# Patient Record
Sex: Female | Born: 1985 | Race: Black or African American | Hispanic: No | Marital: Single | Smoking: Current every day smoker
Health system: Southern US, Community
[De-identification: ages and names within clinical notes are randomized; demographics above are authoritative.]

## PROBLEM LIST (undated history)

## (undated) DIAGNOSIS — IMO0001 Reserved for inherently not codable concepts without codable children: Secondary | ICD-10-CM

## (undated) DIAGNOSIS — R569 Unspecified convulsions: Secondary | ICD-10-CM

## (undated) DIAGNOSIS — D649 Anemia, unspecified: Secondary | ICD-10-CM

## (undated) HISTORY — DX: Anemia, unspecified: D64.9

---
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
## 2007-01-05 ENCOUNTER — Observation Stay: Payer: Self-pay

## 2007-01-12 ENCOUNTER — Inpatient Hospital Stay: Payer: Self-pay | Admitting: Obstetrics and Gynecology

## 2007-02-11 ENCOUNTER — Emergency Department: Payer: Self-pay | Admitting: Emergency Medicine

## 2007-04-11 ENCOUNTER — Emergency Department: Payer: Self-pay | Admitting: Emergency Medicine

## 2008-03-25 ENCOUNTER — Emergency Department: Payer: Self-pay | Admitting: Emergency Medicine

## 2008-06-14 ENCOUNTER — Emergency Department: Payer: Self-pay | Admitting: Emergency Medicine

## 2009-01-13 ENCOUNTER — Emergency Department: Payer: Self-pay | Admitting: Emergency Medicine

## 2009-05-03 ENCOUNTER — Ambulatory Visit: Payer: Self-pay | Admitting: Neurology

## 2010-01-06 ENCOUNTER — Emergency Department: Payer: Self-pay | Admitting: Emergency Medicine

## 2013-01-05 DIAGNOSIS — Z720 Tobacco use: Secondary | ICD-10-CM | POA: Insufficient documentation

## 2013-01-05 DIAGNOSIS — O0990 Supervision of high risk pregnancy, unspecified, unspecified trimester: Secondary | ICD-10-CM | POA: Insufficient documentation

## 2013-01-05 DIAGNOSIS — D649 Anemia, unspecified: Secondary | ICD-10-CM | POA: Insufficient documentation

## 2013-01-05 DIAGNOSIS — R87619 Unspecified abnormal cytological findings in specimens from cervix uteri: Secondary | ICD-10-CM | POA: Insufficient documentation

## 2013-01-05 DIAGNOSIS — E663 Overweight: Secondary | ICD-10-CM | POA: Insufficient documentation

## 2013-01-05 DIAGNOSIS — G40909 Epilepsy, unspecified, not intractable, without status epilepticus: Secondary | ICD-10-CM | POA: Insufficient documentation

## 2013-03-04 ENCOUNTER — Observation Stay: Payer: Self-pay | Admitting: Obstetrics and Gynecology

## 2013-03-04 LAB — URINALYSIS, COMPLETE
Bilirubin,UR: NEGATIVE
Glucose,UR: NEGATIVE mg/dL (ref 0–75)
Ketone: NEGATIVE
Nitrite: NEGATIVE
Ph: 7 (ref 4.5–8.0)
Specific Gravity: 1.011 (ref 1.003–1.030)

## 2013-03-06 LAB — URINE CULTURE

## 2013-05-16 DIAGNOSIS — A568 Sexually transmitted chlamydial infection of other sites: Secondary | ICD-10-CM | POA: Insufficient documentation

## 2013-05-27 HISTORY — PX: TUBAL LIGATION: SHX77

## 2015-07-15 ENCOUNTER — Emergency Department
Admission: EM | Admit: 2015-07-15 | Discharge: 2015-07-15 | Disposition: A | Discharge status: Home / Self Care | Payer: Self-pay | Attending: Emergency Medicine | Admitting: Emergency Medicine

## 2015-07-15 ENCOUNTER — Encounter: Payer: Self-pay | Admitting: Emergency Medicine

## 2015-07-15 DIAGNOSIS — R109 Unspecified abdominal pain: Secondary | ICD-10-CM | POA: Insufficient documentation

## 2015-07-15 DIAGNOSIS — Z3202 Encounter for pregnancy test, result negative: Secondary | ICD-10-CM | POA: Insufficient documentation

## 2015-07-15 DIAGNOSIS — R12 Heartburn: Secondary | ICD-10-CM | POA: Insufficient documentation

## 2015-07-15 DIAGNOSIS — F17210 Nicotine dependence, cigarettes, uncomplicated: Secondary | ICD-10-CM | POA: Insufficient documentation

## 2015-07-15 DIAGNOSIS — R197 Diarrhea, unspecified: Secondary | ICD-10-CM | POA: Insufficient documentation

## 2015-07-15 DIAGNOSIS — R1110 Vomiting, unspecified: Secondary | ICD-10-CM | POA: Insufficient documentation

## 2015-07-15 HISTORY — DX: Unspecified convulsions: R56.9

## 2015-07-15 LAB — URINALYSIS COMPLETE WITH MICROSCOPIC (ARMC ONLY)
BACTERIA UA: NONE SEEN
GLUCOSE, UA: NEGATIVE mg/dL
HGB URINE DIPSTICK: NEGATIVE
NITRITE: NEGATIVE
Protein, ur: 100 mg/dL — AB
SPECIFIC GRAVITY, URINE: 1.032 — AB (ref 1.005–1.030)
pH: 5 (ref 5.0–8.0)

## 2015-07-15 LAB — CBC
HEMATOCRIT: 34.7 % — AB (ref 35.0–47.0)
HEMOGLOBIN: 11.4 g/dL — AB (ref 12.0–16.0)
MCH: 24.3 pg — ABNORMAL LOW (ref 26.0–34.0)
MCHC: 32.8 g/dL (ref 32.0–36.0)
MCV: 74 fL — ABNORMAL LOW (ref 80.0–100.0)
Platelets: 258 10*3/uL (ref 150–440)
RBC: 4.69 MIL/uL (ref 3.80–5.20)
RDW: 16.5 % — AB (ref 11.5–14.5)
WBC: 12 10*3/uL — AB (ref 3.6–11.0)

## 2015-07-15 LAB — COMPREHENSIVE METABOLIC PANEL
ALT: 980 U/L — ABNORMAL HIGH (ref 14–54)
ANION GAP: 8 (ref 5–15)
AST: 1480 U/L — ABNORMAL HIGH (ref 15–41)
Albumin: 4.3 g/dL (ref 3.5–5.0)
Alkaline Phosphatase: 293 U/L — ABNORMAL HIGH (ref 38–126)
BILIRUBIN TOTAL: 1.6 mg/dL — AB (ref 0.3–1.2)
BUN: 9 mg/dL (ref 6–20)
CO2: 24 mmol/L (ref 22–32)
Calcium: 9.4 mg/dL (ref 8.9–10.3)
Chloride: 106 mmol/L (ref 101–111)
Creatinine, Ser: 0.7 mg/dL (ref 0.44–1.00)
GFR calc Af Amer: >60 mL/min (ref >60)
Glucose, Bld: 150 mg/dL — ABNORMAL HIGH (ref 65–99)
POTASSIUM: 3.7 mmol/L (ref 3.5–5.1)
Sodium: 138 mmol/L (ref 135–145)
TOTAL PROTEIN: 8 g/dL (ref 6.5–8.1)

## 2015-07-15 LAB — LIPASE, BLOOD: Lipase: 1557 U/L — ABNORMAL HIGH (ref 11–51)

## 2015-07-15 LAB — POCT PREGNANCY, URINE: PREG TEST UR: NEGATIVE

## 2015-07-15 NOTE — ED Notes (Signed)
Pt missed am dose of Keppra and also threw up last nights dose.

## 2015-07-15 NOTE — ED Notes (Signed)
Pt reports multiple episodes of vomiting last night and today. Diarrhea started yesterday. Reports having frequent episodes of heartburn that she has had for 9 months.  Pt takes nexium every other day. Unable to keep any liquids down today. C/o abdominal pain from middle of the chest down through epigastric region.

## 2015-07-15 NOTE — ED Provider Notes (Signed)
Patient not in room when I went to go see her  Breanna Every, MD 07/15/15 1534

## 2015-07-18 ENCOUNTER — Telehealth: Payer: Self-pay | Admitting: Emergency Medicine

## 2015-07-18 LAB — LEVETIRACETAM LEVEL: LEVETIRACETAM: NOT DETECTED ug/mL (ref 10.0–40.0)

## 2015-07-18 NOTE — ED Notes (Signed)
Called patient due to lwot to inquire about condition and follow up plans. Left message with my number. 

## 2015-07-19 DIAGNOSIS — K8020 Calculus of gallbladder without cholecystitis without obstruction: Secondary | ICD-10-CM | POA: Insufficient documentation

## 2015-07-19 DIAGNOSIS — F17210 Nicotine dependence, cigarettes, uncomplicated: Secondary | ICD-10-CM | POA: Insufficient documentation

## 2015-07-19 LAB — CBC
HCT: 37.3 % (ref 35.0–47.0)
HEMOGLOBIN: 12.1 g/dL (ref 12.0–16.0)
MCH: 23.9 pg — ABNORMAL LOW (ref 26.0–34.0)
MCHC: 32.4 g/dL (ref 32.0–36.0)
MCV: 73.8 fL — AB (ref 80.0–100.0)
PLATELETS: 339 10*3/uL (ref 150–440)
RBC: 5.06 MIL/uL (ref 3.80–5.20)
RDW: 16.4 % — ABNORMAL HIGH (ref 11.5–14.5)
WBC: 6 10*3/uL (ref 3.6–11.0)

## 2015-07-19 NOTE — ED Notes (Signed)
Pt started having n.v.d since Friday, was seen here Saturday for the same but left without being seen by MD.  Was called back for abnormal blood work was it "might be gallbladder".  Patient was called Saturday but could not come in till today.  States all symptoms have been relieved.

## 2015-07-20 ENCOUNTER — Emergency Department: Payer: Self-pay

## 2015-07-20 ENCOUNTER — Encounter: Payer: Self-pay | Admitting: Emergency Medicine

## 2015-07-20 ENCOUNTER — Emergency Department
Admission: EM | Admit: 2015-07-20 | Discharge: 2015-07-20 | Disposition: A | Discharge status: Home / Self Care | Payer: Self-pay | Attending: Emergency Medicine | Admitting: Emergency Medicine

## 2015-07-20 DIAGNOSIS — K8020 Calculus of gallbladder without cholecystitis without obstruction: Secondary | ICD-10-CM

## 2015-07-20 LAB — COMPREHENSIVE METABOLIC PANEL
ALBUMIN: 4.8 g/dL (ref 3.5–5.0)
ALK PHOS: 222 U/L — AB (ref 38–126)
ALT: 294 U/L — ABNORMAL HIGH (ref 14–54)
ANION GAP: 12 (ref 5–15)
AST: 49 U/L — AB (ref 15–41)
BUN: 12 mg/dL (ref 6–20)
CALCIUM: 9.7 mg/dL (ref 8.9–10.3)
CO2: 27 mmol/L (ref 22–32)
Chloride: 102 mmol/L (ref 101–111)
Creatinine, Ser: 0.78 mg/dL (ref 0.44–1.00)
GFR calc Af Amer: >60 mL/min (ref >60)
GFR calc non Af Amer: >60 mL/min (ref >60)
GLUCOSE: 108 mg/dL — AB (ref 65–99)
Potassium: 3 mmol/L — ABNORMAL LOW (ref 3.5–5.1)
SODIUM: 141 mmol/L (ref 135–145)
Total Bilirubin: 0.5 mg/dL (ref 0.3–1.2)
Total Protein: 8.9 g/dL — ABNORMAL HIGH (ref 6.5–8.1)

## 2015-07-20 LAB — LIPASE, BLOOD: Lipase: 128 U/L — ABNORMAL HIGH (ref 11–51)

## 2015-07-20 NOTE — Discharge Instructions (Signed)
As we discussed please follow-up with Gen. surgery by calling the number provided today to arrange a follow-up appointment as soon as possible. Return to the emergency department for any abdominal pain or fever.   Cholelithiasis Cholelithiasis (also called gallstones) is a form of gallbladder disease in which gallstones form in your gallbladder. The gallbladder is an organ that stores bile made in the liver, which helps digest fats. Gallstones begin as small crystals and slowly grow into stones. Gallstone pain occurs when the gallbladder spasms and a gallstone is blocking the duct. Pain can also occur when a stone passes out of the duct.  RISK FACTORS  Being female.   Having multiple pregnancies. Health care providers sometimes advise removing diseased gallbladders before future pregnancies.   Being obese.  Eating a diet heavy in fried foods and fat.   Being older than 60 years and increasing age.   Prolonged use of medicines containing female hormones.   Having diabetes mellitus.   Rapidly losing weight.   Having a family history of gallstones (heredity).  SYMPTOMS  Nausea.   Vomiting.  Abdominal pain.   Yellowing of the skin (jaundice).   Sudden pain. It may persist from several minutes to several hours.  Fever.   Tenderness to the touch. In some cases, when gallstones do not move into the bile duct, people have no pain or symptoms. These are called "silent" gallstones.  TREATMENT Silent gallstones do not need treatment. In severe cases, emergency surgery may be required. Options for treatment include:  Surgery to remove the gallbladder. This is the most common treatment.  Medicines. These do not always work and may take 6-12 months or more to work.  Shock wave treatment (extracorporeal biliary lithotripsy). In this treatment an ultrasound machine sends shock waves to the gallbladder to break gallstones into smaller pieces that can pass into the  intestines or be dissolved by medicine. HOME CARE INSTRUCTIONS   Only take over-the-counter or prescription medicines for pain, discomfort, or fever as directed by your health care provider.   Follow a low-fat diet until seen again by your health care provider. Fat causes the gallbladder to contract, which can result in pain.   Follow up with your health care provider as directed. Attacks are almost always recurrent and surgery is usually required for permanent treatment.  SEEK IMMEDIATE MEDICAL CARE IF:   Your pain increases and is not controlled by medicines.   You have a fever or persistent symptoms for more than 2-3 days.   You have a fever and your symptoms suddenly get worse.   You have persistent nausea and vomiting.  MAKE SURE YOU:   Understand these instructions.  Will watch your condition.  Will get help right away if you are not doing well or get worse.   This information is not intended to replace advice given to you by your health care provider. Make sure you discuss any questions you have with your health care provider.   Document Released: 05/09/2005 Document Revised: 01/13/2013 Document Reviewed: 11/04/2012 Elsevier Interactive Patient Education [employer]

## 2015-07-20 NOTE — ED Provider Notes (Signed)
[facility] Emergency Department Provider Note  Time seen: 3:33 AM  I have reviewed the triage vital signs and the nursing notes.   HISTORY  Chief Complaint Emesis    HPI Breanna Meyer is a 30 y.o. female with a past medical history of a seizure disorder presents the emergency department for abnormal labs. According to the patient and record review the patient was seen in the emergency department 5 days ago for severe abdominal pain. Patient had her labs drawn but then left the emergency department before she be seen by a provider. Patient was called back due to her elevated liver function tests as well is lipase. Patient states she cannot return to the emergency department until today so she came back today. States he abdominal pain has resolved. The nausea and vomiting have resolved. She continues to have occasional loose stool. Denies any fever. States the abdominal pain was severe 5 days ago located mostly in the upper abdomen. Today states 0/10.     Past Medical History  Diagnosis Date  . Seizures (HCC)     There are no active problems to display for this patient.   Past Surgical History  Procedure Laterality Date  . Tubal ligation      No current outpatient prescriptions on file.  Allergies Review of patient's allergies indicates no known allergies.  History reviewed. No pertinent family history.  Social History Social History  Substance Use Topics  . Smoking status: Current Every Day Smoker -- 0.50 packs/day    Types: Cigarettes  . Smokeless tobacco: None  . Alcohol Use: Yes    Review of Systems Constitutional: Negative for fever. Cardiovascular: Negative for chest pain. Respiratory: Negative for shortness of breath. Gastrointestinal: Positive for upper abdominal pain 5 days ago, now resolved.  Genitourinary: Negative for dysuria. Musculoskeletal: Negative for back pain. Neurological: Negative for headache 10-point ROS otherwise  negative.  ____________________________________________   PHYSICAL EXAM:  VITAL SIGNS: ED Triage Vitals  Enc Vitals Group     BP 07/19/15 2332 130/80 mmHg     Pulse Rate 07/19/15 2332 89     Resp 07/19/15 2332 18     Temp 07/19/15 2332 98.2 F (36.8 C)     Temp Source 07/19/15 2332 Oral     SpO2 07/19/15 2332 100 %     Weight 07/19/15 2332 185 lb (83.915 kg)     Height 07/19/15 2332  (1.702 m)     Head Cir --      Peak Flow --      Pain Score 07/19/15 2333 0     Pain Loc --      Pain Edu? --      Excl. in GC? --     Constitutional: Alert and oriented. Well appearing and in no distress. Eyes: Normal exam ENT   Head: Normocephalic and atraumatic.   Mouth/Throat: Mucous membranes are moist. Cardiovascular: Normal rate, regular rhythm. No murmur Respiratory: Normal respiratory effort without tachypnea nor retractions. Breath sounds are clear Gastrointestinal: Soft and nontender. No distention.   Musculoskeletal: Nontender with normal range of motion in all extremities. Neurologic:  Normal speech and language. No gross focal neurologic deficits  Skin:  Skin is warm, dry and intact.  Psychiatric: Mood and affect are normal. Speech and behavior are normal.  ____________________________________________   RADIOLOGY  Ultrasound shows multiple stones within the gallbladder however no signs of acute cholecystitis.  ____________________________________________    INITIAL IMPRESSION / ASSESSMENT AND PLAN / ED COURSE  Pertinent labs & imaging results that were available during my care of the patient were reviewed by me and considered in my medical decision making (see chart for details).  Patient presents for abnormal labs, states her abdominal pain has resolved. She has a nontender abdominal exam. Patient's lab work was most consistent with likely cold lead to cholelithiasis, however it appears to have spontaneously resolved. Patient is lipase is trending down,  LFTs are trending down. Patient is afebrile, normal vitals, denies any pain, nausea, or vomiting. I discussed the patient with surgery, they state they will likely see the patient in the office on Friday, I discussed this plan. The patient he will call today to arrange the appointment for elective cholecystectomy. I discussed very strict return precautions to which the patient is agreeable.  ____________________________________________   FINAL CLINICAL IMPRESSION(S) / ED DIAGNOSES  Resolved choledocholithiasis Cholelithiasis   Minna Antis, MD 07/20/15 [phone]

## 2015-07-20 NOTE — ED Notes (Signed)
MD at bedside. 

## 2015-07-20 NOTE — ED Notes (Signed)
Patient with no complaints at this time. Respirations even and unlabored. Skin warm/dry. Discharge instructions reviewed with patient at this time. Patient given opportunity to voice concerns/ask questions. Patient discharged at this time and left Emergency Department with steady gait.   

## 2015-07-23 ENCOUNTER — Other Ambulatory Visit: Payer: Self-pay

## 2015-07-25 ENCOUNTER — Encounter: Payer: Self-pay | Admitting: General Surgery

## 2015-07-25 ENCOUNTER — Ambulatory Visit (INDEPENDENT_AMBULATORY_CARE_PROVIDER_SITE_OTHER): Payer: Self-pay | Admitting: General Surgery

## 2015-07-25 VITALS — BP 119/69 | HR 83 | Temp 98.2°F | Ht 67.0 in | Wt 182.0 lb

## 2015-07-25 DIAGNOSIS — K851 Biliary acute pancreatitis without necrosis or infection: Secondary | ICD-10-CM

## 2015-07-25 NOTE — Patient Instructions (Signed)
You have requested to have your Gallbladder removed. We will arrange this to be done on 08/11/15 at [facility].  You will be off from work for approximately 1-2 weeks depending on your recovery.   Please avoid greasy and fried foods if at all possible prior to your scheduled surgery to decrease symptoms until then.  Please see the [facility]) pre-care form you have been given today.  If you have any questions or concerns please call our office.

## 2015-07-26 DIAGNOSIS — IMO0001 Reserved for inherently not codable concepts without codable children: Secondary | ICD-10-CM

## 2015-07-26 HISTORY — DX: Reserved for inherently not codable concepts without codable children: IMO0001

## 2015-07-26 NOTE — Progress Notes (Signed)
Patient ID: Breanna Meyer, female   DOB: [birth year], 30 y.o.   MRN: [id number]  CC: ABDOMINAL PAIN  HPI Breanna Meyer is a 30 y.o. female presents to clinic for follow-up from recent ER visits for abdominal pain. Patient states that several weeks ago she had horrible abdominal pain for which she sought care in the emergency department. She had labs drawn however the weight was along that she eventually went home without being seen. She states that she had a formal abdominal pain for approximately 5 days and was unable to eat anything but was able to stay hydrated. She was called from the ER due to abnormal labs her to return to the ER for evaluation but was unable to do so for 5 days. Upon return to the emergency department her pain had completely resolved and her abnormal labs have normalized. During the pain she had nausea with some vomiting as well as some diarrhea associated with her abdominal pain chills but no fevers. Currently she denies any complaints and states she has returned to her usual state of excellent health. She does have a history of seizures but is well controlled on Keppra.  HPI  Past Medical History  Diagnosis Date  . Seizures (HCC) 1st Seizure- 2007  . Anemia     During Pregnancy    Past Surgical History  Procedure Laterality Date  . Tubal ligation  2015    Family History  Problem Relation Age of Onset  . Hypertension Mother     Social History Social History  Substance Use Topics  . Smoking status: Current Every Day Smoker -- 0.50 packs/day    Types: Cigarettes  . Smokeless tobacco: Never Used  . Alcohol Use: Yes     Comment: 10 shots/ Liquor weekly    No Known Allergies  Current Outpatient Prescriptions  Medication Sig Dispense Refill  . levETIRAcetam (KEPPRA) 500 MG tablet Take 500 mg by mouth 2 (two) times daily.  11   No current facility-administered medications for this visit.     Review of Systems A multi-point review of systems was asked and  was negative except for the findings documented in the history of present illness  Physical Exam Blood pressure 119/69, pulse 83, temperature 98.2 F (36.8 C), temperature source Oral, height  (1.702 m), weight 82.555 kg (182 lb), last menstrual period 07/13/2015. CONSTITUTIONAL: No acute distress. EYES: Pupils are equal, round, and reactive to light, Sclera are non-icteric. EARS, NOSE, MOUTH AND THROAT: The oropharynx is clear. The oral mucosa is pink and moist. Hearing is intact to voice. LYMPH NODES:  Lymph nodes in the neck are normal. RESPIRATORY:  Lungs are clear. There is normal respiratory effort, with equal breath sounds bilaterally, and without pathologic use of accessory muscles. CARDIOVASCULAR: Heart is regular without murmurs, gallops, or rubs. GI: The abdomen is  soft, nontender, and nondistended. There are no palpable masses. There is no hepatosplenomegaly. There are normal bowel sounds in all quadrants. GU: Rectal deferred.   MUSCULOSKELETAL: Normal muscle strength and tone. No cyanosis or edema.   SKIN: Turgor is good and there are no pathologic skin lesions or ulcers. NEUROLOGIC: Motor and sensation is grossly normal. Cranial nerves are grossly intact. PSYCH:  Oriented to person, place and time. Affect is normal.  Data Reviewed Images and labs reviewed. Labs from February 18 are concerning for gallstone pancreatitis, labs from February 22nd are almost all completed within normal limits. Ultrasound obtained from ER does show evidence of cholelithiasis. I have personally  reviewed the patient's imaging, laboratory findings and medical records.    Assessment    History of gallstone pancreatitis    Plan    30 year old female has had a recent bout of gallstone pancreatitis. Discussed that given her findings with continued gallstones that is possible for Recurrent pancreatitis. Discussed that every bout of pancreatitis tends to be more severe than the one before this and  that it is recommended to have her gallbladder removed. I discussed the procedure in detail.  The patient was given Agricultural engineer.  We discussed the risks and benefits of a laparoscopic cholecystectomy and possible cholangiogram including, but not limited to bleeding, infection, injury to surrounding structures such as the intestine or liver, bile leak, retained gallstones, need to convert to an open procedure, prolonged diarrhea, blood clots such as  DVT, common bile duct injury, anesthesia risks, and possible need for additional procedures.  The likelihood of improvement in symptoms and return to the patient's normal status is good. We discussed the typical post-operative recovery course. Patient voiced understanding and wished to proceed. Plan for surgery on 08/11/2015     Time spent with the patient was 60 minutes, with more than 50% of the time spent in face-to-face education, counseling and care coordination.     Ricarda Frame, MD FACS General Surgeon 07/26/2015, 11:06 AM

## 2015-07-27 ENCOUNTER — Telehealth: Payer: Self-pay | Admitting: General Surgery

## 2015-07-27 NOTE — Telephone Encounter (Signed)
Pt advised of pre op date/time and sx date. Sx: 08/11/15 with Dr Devoria Albe cholecystectomy with IOC. Pre op: 08/04/15 between 9-1pm--Phone.   Patient made aware to call [phone], between 1-3:00pm the day before surgery, to find out what time to arrive.     Patient has made a deposit of 300.00 towards physician charges via VISA.

## 2015-08-04 ENCOUNTER — Encounter: Payer: Self-pay | Admitting: *Deleted

## 2015-08-04 ENCOUNTER — Other Ambulatory Visit: Payer: Self-pay

## 2015-08-04 NOTE — Patient Instructions (Signed)
  Your procedure is scheduled on: 08-11-15 (FRIDAY) Report to [facility] 2ND FLOOR To find out your arrival time please call [phone] between 1PM - 3PM on 08-10-15 (THURSDAY)  Remember: Instructions that are not followed completely may result in serious medical risk, up to and including death, or upon the discretion of your surgeon and anesthesiologist your surgery may need to be rescheduled.    _X___ 1. Do not eat food or drink liquids after midnight. No gum chewing or hard candies.     _X___ 2. No Alcohol for 24 hours before or after surgery.   ____ 3. Bring all medications with you on the day of surgery if instructed.    _X___ 4. Notify your doctor if there is any change in your medical condition     (cold, fever, infections).     Do not wear jewelry, make-up, hairpins, clips or nail polish.  Do not wear lotions, powders, or perfumes. You may wear deodorant.  Do not shave 48 hours prior to surgery. Men may shave face and neck.  Do not bring valuables to the hospital.    [facility] is not responsible for any belongings or valuables.               Contacts, dentures or bridgework may not be worn into surgery.  Leave your suitcase in the car. After surgery it may be brought to your room.  For patients admitted to the hospital, discharge time is determined by your treatment team.   Patients discharged the day of surgery will not be allowed to drive home.   Please read over the following fact sheets that you were given:     _X___ Take these medicines the morning of surgery with A SIP OF WATER:    1. KEPPRA  2.   3.   4.  5.  6.  ____ Fleet Enema (as directed)   _X___ Use CHG Soap as directed  ____ Use inhalers on the day of surgery  ____ Stop metformin 2 days prior to surgery    ____ Take 1/2 of usual insulin dose the night before surgery and none on the morning of surgery.   ____ Stop Coumadin/Plavix/aspirin-N/A  ____ Stop  Anti-inflammatories-NO NSAIDS OR ASPIRIN PRODUCTS-TYLENOL OK TO TAKE   ____ Stop supplements until after surgery.    ____ Bring C-Pap to the hospital.

## 2015-08-07 ENCOUNTER — Encounter
Admission: RE | Admit: 2015-08-07 | Discharge: 2015-08-07 | Disposition: A | Discharge status: Home / Self Care | Payer: Self-pay | Source: Ambulatory Visit | Attending: General Surgery | Admitting: General Surgery

## 2015-08-07 DIAGNOSIS — Z01812 Encounter for preprocedural laboratory examination: Secondary | ICD-10-CM | POA: Insufficient documentation

## 2015-08-07 LAB — POTASSIUM: Potassium: 3.5 mmol/L (ref 3.5–5.1)

## 2015-08-10 ENCOUNTER — Encounter: Payer: Self-pay | Admitting: *Deleted

## 2015-08-11 ENCOUNTER — Ambulatory Visit: Payer: Self-pay | Admitting: Anesthesiology

## 2015-08-11 ENCOUNTER — Ambulatory Visit: Payer: Self-pay

## 2015-08-11 ENCOUNTER — Encounter: Payer: Self-pay | Admitting: *Deleted

## 2015-08-11 ENCOUNTER — Encounter
Admission: RE | Disposition: A | Discharge status: Home / Self Care | Payer: Self-pay | Source: Ambulatory Visit | Attending: General Surgery

## 2015-08-11 ENCOUNTER — Observation Stay
Admission: RE | Admit: 2015-08-11 | Discharge: 2015-08-12 | Disposition: A | Discharge status: Home / Self Care | Payer: Self-pay | Source: Ambulatory Visit | Attending: General Surgery | Admitting: General Surgery

## 2015-08-11 DIAGNOSIS — Z9851 Tubal ligation status: Secondary | ICD-10-CM | POA: Insufficient documentation

## 2015-08-11 DIAGNOSIS — F17210 Nicotine dependence, cigarettes, uncomplicated: Secondary | ICD-10-CM | POA: Insufficient documentation

## 2015-08-11 DIAGNOSIS — K859 Acute pancreatitis without necrosis or infection, unspecified: Secondary | ICD-10-CM | POA: Insufficient documentation

## 2015-08-11 DIAGNOSIS — R109 Unspecified abdominal pain: Secondary | ICD-10-CM | POA: Diagnosis present

## 2015-08-11 DIAGNOSIS — Z8249 Family history of ischemic heart disease and other diseases of the circulatory system: Secondary | ICD-10-CM | POA: Insufficient documentation

## 2015-08-11 DIAGNOSIS — Z79899 Other long term (current) drug therapy: Secondary | ICD-10-CM | POA: Insufficient documentation

## 2015-08-11 DIAGNOSIS — R569 Unspecified convulsions: Secondary | ICD-10-CM | POA: Insufficient documentation

## 2015-08-11 DIAGNOSIS — K8012 Calculus of gallbladder with acute and chronic cholecystitis without obstruction: Principal | ICD-10-CM | POA: Insufficient documentation

## 2015-08-11 DIAGNOSIS — K8020 Calculus of gallbladder without cholecystitis without obstruction: Secondary | ICD-10-CM | POA: Insufficient documentation

## 2015-08-11 HISTORY — PX: CHOLECYSTECTOMY: SHX55

## 2015-08-11 HISTORY — DX: Reserved for inherently not codable concepts without codable children: IMO0001

## 2015-08-11 LAB — COMPREHENSIVE METABOLIC PANEL WITH GFR
ALT: 42 U/L (ref 14–54)
AST: 66 U/L — ABNORMAL HIGH (ref 15–41)
Albumin: 3.9 g/dL (ref 3.5–5.0)
Alkaline Phosphatase: 74 U/L (ref 38–126)
Anion gap: 5 (ref 5–15)
BUN: 9 mg/dL (ref 6–20)
CO2: 25 mmol/L (ref 22–32)
Calcium: 8.9 mg/dL (ref 8.9–10.3)
Chloride: 107 mmol/L (ref 101–111)
Creatinine, Ser: 0.83 mg/dL (ref 0.44–1.00)
GFR calc Af Amer: >60 mL/min (ref >60)
GFR calc non Af Amer: >60 mL/min (ref >60)
Glucose, Bld: 119 mg/dL — ABNORMAL HIGH (ref 65–99)
Potassium: 3.8 mmol/L (ref 3.5–5.1)
Sodium: 137 mmol/L (ref 135–145)
Total Bilirubin: 0.2 mg/dL — ABNORMAL LOW (ref 0.3–1.2)
Total Protein: 7.3 g/dL (ref 6.5–8.1)

## 2015-08-11 LAB — POCT PREGNANCY, URINE: PREG TEST UR: NEGATIVE

## 2015-08-11 SURGERY — LAPAROSCOPIC CHOLECYSTECTOMY WITH INTRAOPERATIVE CHOLANGIOGRAM
Anesthesia: General | Wound class: Clean Contaminated

## 2015-08-11 MED ORDER — LEVETIRACETAM 500 MG PO TABS
500.0000 mg | ORAL_TABLET | Freq: Two times a day (BID) | ORAL | Status: DC
Start: 2015-08-11 — End: 2015-08-12
  Administered 2015-08-11 – 2015-08-12 (×2): 500 mg via ORAL
  Filled 2015-08-11 (×2): qty 1

## 2015-08-11 MED ORDER — DEXTROSE 5 % IV SOLN
1.0000 g | INTRAVENOUS | Status: AC
  Administered 2015-08-11: 1 g via INTRAVENOUS
  Filled 2015-08-11: qty 1

## 2015-08-11 MED ORDER — CHLORHEXIDINE GLUCONATE 4 % EX LIQD: 1.0000 "application " | Freq: Once | CUTANEOUS | Status: DC

## 2015-08-11 MED ORDER — PROMETHAZINE HCL 25 MG/ML IJ SOLN
6.2500 mg | Freq: Once | INTRAMUSCULAR | Status: AC
  Administered 2015-08-11: 6.25 mg via INTRAVENOUS

## 2015-08-11 MED ORDER — FAMOTIDINE 20 MG PO TABS
ORAL_TABLET | ORAL | Status: AC
Start: 2015-08-11 — End: 2015-08-11
  Administered 2015-08-11: 20 mg via ORAL
  Filled 2015-08-11: qty 1

## 2015-08-11 MED ORDER — LACTATED RINGERS IV SOLN
INTRAVENOUS | Status: DC
  Administered 2015-08-11 (×2): via INTRAVENOUS

## 2015-08-11 MED ORDER — ONDANSETRON 8 MG PO TBDP: 4.0000 mg | ORAL_TABLET | Freq: Four times a day (QID) | ORAL | Status: DC | PRN

## 2015-08-11 MED ORDER — SODIUM CHLORIDE 0.9 % IJ SOLN
INTRAMUSCULAR | Status: AC
  Filled 2015-08-11: qty 10

## 2015-08-11 MED ORDER — KCL IN DEXTROSE-NACL 20-5-0.45 MEQ/L-%-% IV SOLN
INTRAVENOUS | Status: DC
  Administered 2015-08-11 – 2015-08-12 (×3): via INTRAVENOUS
  Filled 2015-08-11 (×5): qty 1000

## 2015-08-11 MED ORDER — IOTHALAMATE MEGLUMINE 60 % INJ SOLN
INTRAMUSCULAR | Status: DC | PRN
  Administered 2015-08-11: 50 mL

## 2015-08-11 MED ORDER — DIPHENHYDRAMINE HCL 25 MG PO CAPS: 25.0000 mg | ORAL_CAPSULE | Freq: Four times a day (QID) | ORAL | Status: DC | PRN

## 2015-08-11 MED ORDER — HYDROCODONE-ACETAMINOPHEN 5-325 MG PO TABS
1.0000 | ORAL_TABLET | ORAL | Status: DC | PRN
  Administered 2015-08-11: 1 via ORAL
  Administered 2015-08-12: 2 via ORAL
  Filled 2015-08-11 (×2): qty 2
  Filled 2015-08-11: qty 1

## 2015-08-11 MED ORDER — FAMOTIDINE 20 MG PO TABS
20.0000 mg | ORAL_TABLET | Freq: Once | ORAL | Status: AC
  Administered 2015-08-11: 20 mg via ORAL

## 2015-08-11 MED ORDER — FENTANYL CITRATE (PF) 100 MCG/2ML IJ SOLN: 25.0000 ug | INTRAMUSCULAR | Status: DC | PRN

## 2015-08-11 MED ORDER — ONDANSETRON HCL 4 MG/2ML IJ SOLN
INTRAMUSCULAR | Status: DC | PRN
  Administered 2015-08-11: 4 mg via INTRAVENOUS

## 2015-08-11 MED ORDER — LIDOCAINE HCL (PF) 1 % IJ SOLN
INTRAMUSCULAR | Status: AC
  Filled 2015-08-11: qty 30

## 2015-08-11 MED ORDER — SODIUM CHLORIDE 0.9 % IR SOLN
Status: DC | PRN
  Administered 2015-08-11: 1000 mL

## 2015-08-11 MED ORDER — PROMETHAZINE HCL 25 MG/ML IJ SOLN
12.5000 mg | Freq: Once | INTRAMUSCULAR | Status: AC
Start: 2015-08-11 — End: 2015-08-11
  Administered 2015-08-11: 12.5 mg via INTRAVENOUS

## 2015-08-11 MED ORDER — MIDAZOLAM HCL 2 MG/2ML IJ SOLN
INTRAMUSCULAR | Status: DC | PRN
  Administered 2015-08-11: 2 mg via INTRAVENOUS

## 2015-08-11 MED ORDER — ROCURONIUM BROMIDE 100 MG/10ML IV SOLN
INTRAVENOUS | Status: DC | PRN
  Administered 2015-08-11 (×2): 15 mg via INTRAVENOUS
  Administered 2015-08-11: 10 mg via INTRAVENOUS
  Administered 2015-08-11: 5 mg via INTRAVENOUS

## 2015-08-11 MED ORDER — KETOROLAC TROMETHAMINE 30 MG/ML IJ SOLN
INTRAMUSCULAR | Status: DC | PRN
  Administered 2015-08-11: 30 mg via INTRAVENOUS

## 2015-08-11 MED ORDER — FENTANYL CITRATE (PF) 100 MCG/2ML IJ SOLN
INTRAMUSCULAR | Status: DC | PRN
  Administered 2015-08-11: 75 ug via INTRAVENOUS
  Administered 2015-08-11: 50 ug via INTRAVENOUS

## 2015-08-11 MED ORDER — HYDROCODONE-ACETAMINOPHEN 5-325 MG PO TABS: 1.0000 | ORAL_TABLET | Freq: Four times a day (QID) | ORAL | Status: DC | PRN

## 2015-08-11 MED ORDER — DEXAMETHASONE SODIUM PHOSPHATE 10 MG/ML IJ SOLN
INTRAMUSCULAR | Status: DC | PRN
  Administered 2015-08-11: 5 mg via INTRAVENOUS

## 2015-08-11 MED ORDER — ONDANSETRON HCL 4 MG/2ML IJ SOLN
4.0000 mg | Freq: Once | INTRAMUSCULAR | Status: AC | PRN
  Administered 2015-08-11: 4 mg via INTRAVENOUS

## 2015-08-11 MED ORDER — ONDANSETRON HCL 4 MG/2ML IJ SOLN
INTRAMUSCULAR | Status: AC
  Administered 2015-08-11: 4 mg via INTRAVENOUS
  Filled 2015-08-11: qty 2

## 2015-08-11 MED ORDER — FENTANYL CITRATE (PF) 100 MCG/2ML IJ SOLN
INTRAMUSCULAR | Status: AC
  Filled 2015-08-11: qty 2

## 2015-08-11 MED ORDER — LIDOCAINE HCL 1 % IJ SOLN
INTRAMUSCULAR | Status: DC | PRN
  Administered 2015-08-11: 27 mL via SUBCUTANEOUS

## 2015-08-11 MED ORDER — PROPOFOL 10 MG/ML IV BOLUS
INTRAVENOUS | Status: DC | PRN
  Administered 2015-08-11: 50 mg via INTRAVENOUS
  Administered 2015-08-11: 150 mg via INTRAVENOUS

## 2015-08-11 MED ORDER — PROMETHAZINE HCL 25 MG/ML IJ SOLN
INTRAMUSCULAR | Status: AC
  Administered 2015-08-11: 12.5 mg via INTRAVENOUS
  Filled 2015-08-11: qty 1

## 2015-08-11 MED ORDER — BUPIVACAINE HCL (PF) 0.5 % IJ SOLN
INTRAMUSCULAR | Status: AC
  Filled 2015-08-11: qty 30

## 2015-08-11 MED ORDER — ONDANSETRON HCL 4 MG/2ML IJ SOLN
4.0000 mg | Freq: Four times a day (QID) | INTRAMUSCULAR | Status: DC | PRN
  Administered 2015-08-11: 4 mg via INTRAVENOUS
  Filled 2015-08-11: qty 2

## 2015-08-11 MED ORDER — NEOSTIGMINE METHYLSULFATE 10 MG/10ML IV SOLN
INTRAVENOUS | Status: DC | PRN
  Administered 2015-08-11: 2.5 mg via INTRAVENOUS

## 2015-08-11 MED ORDER — GLYCOPYRROLATE 0.2 MG/ML IJ SOLN
INTRAMUSCULAR | Status: DC | PRN
  Administered 2015-08-11: .5 mg via INTRAVENOUS

## 2015-08-11 MED ORDER — DIPHENHYDRAMINE HCL 50 MG/ML IJ SOLN: 25.0000 mg | Freq: Four times a day (QID) | INTRAMUSCULAR | Status: DC | PRN

## 2015-08-11 MED ORDER — PROMETHAZINE HCL 25 MG/ML IJ SOLN
6.2500 mg | Freq: Once | INTRAMUSCULAR | Status: AC
Start: 2015-08-11 — End: 2015-08-11
  Administered 2015-08-11: 6.25 mg via INTRAVENOUS

## 2015-08-11 MED ORDER — MORPHINE SULFATE (PF) 4 MG/ML IV SOLN
4.0000 mg | INTRAVENOUS | Status: DC | PRN
  Administered 2015-08-11 – 2015-08-12 (×2): 4 mg via INTRAVENOUS
  Filled 2015-08-11 (×2): qty 1

## 2015-08-11 MED ORDER — LIDOCAINE HCL (CARDIAC) 20 MG/ML IV SOLN
INTRAVENOUS | Status: DC | PRN
  Administered 2015-08-11: 40 mg via INTRAVENOUS

## 2015-08-11 SURGICAL SUPPLY — 50 items
APPLIER CLIP ROT 10 11.4 M/L (STAPLE) ×3
APR CLP MED LRG 11.4X10 (STAPLE) ×1
BLADE CLIPPER SURG (BLADE) ×2 IMPLANT
BLADE SURG SZ11 CARB STEEL (BLADE) ×3 IMPLANT
BULB RESERV EVAC DRAIN JP 100C (MISCELLANEOUS) IMPLANT
CANISTER SUCT 1200ML W/VALVE (MISCELLANEOUS) ×3 IMPLANT
CATH CHOLANG 76X19 KUMAR (CATHETERS) ×3 IMPLANT
CHLORAPREP W/TINT 26ML (MISCELLANEOUS) ×5 IMPLANT
CLIP APPLIE ROT 10 11.4 M/L (STAPLE) ×1 IMPLANT
CLOSURE WOUND 1/2 X4 (GAUZE/BANDAGES/DRESSINGS) ×1
CONRAY 60ML FOR OR (MISCELLANEOUS) ×2 IMPLANT
CUTTER FLEX LINEAR 45M (STAPLE) ×2 IMPLANT
DEFOGGER SCOPE WARMER CLEARIFY (MISCELLANEOUS) ×2 IMPLANT
DISSECTOR KITTNER STICK (MISCELLANEOUS) ×1 IMPLANT
DISSECTORS/KITTNER STICK (MISCELLANEOUS)
DRAIN CHANNEL JP 19F (MISCELLANEOUS) IMPLANT
DRAPE SHEET LG 3/4 BI-LAMINATE (DRAPES) ×3 IMPLANT
ELECT REM PT RETURN 9FT ADLT (ELECTROSURGICAL) ×3
ELECTRODE REM PT RTRN 9FT ADLT (ELECTROSURGICAL) ×1 IMPLANT
GAUZE SPONGE 4X4 12PLY STRL (GAUZE/BANDAGES/DRESSINGS) ×3 IMPLANT
GLOVE BIO SURGEON STRL SZ7.5 (GLOVE) ×11 IMPLANT
GLOVE INDICATOR 8.0 STRL GRN (GLOVE) ×9 IMPLANT
GOWN STRL REUS W/ TWL LRG LVL3 (GOWN DISPOSABLE) ×3 IMPLANT
GOWN STRL REUS W/TWL LRG LVL3 (GOWN DISPOSABLE) ×12
IRRIGATION STRYKERFLOW (MISCELLANEOUS) IMPLANT
IRRIGATOR STRYKERFLOW (MISCELLANEOUS)
IV NS 1000ML (IV SOLUTION)
IV NS 1000ML BAXH (IV SOLUTION) IMPLANT
L-HOOK LAP DISP 36CM (ELECTROSURGICAL) ×3
LABEL OR SOLS (LABEL) ×3 IMPLANT
LHOOK LAP DISP 36CM (ELECTROSURGICAL) ×1 IMPLANT
NDL HYPO 25X1 1.5 SAFETY (NEEDLE) ×1 IMPLANT
NEEDLE HYPO 25X1 1.5 SAFETY (NEEDLE) ×3 IMPLANT
NEEDLE VERESS 14GA 120MM (NEEDLE) ×3 IMPLANT
NS IRRIG 500ML POUR BTL (IV SOLUTION) ×3 IMPLANT
PACK LAP CHOLECYSTECTOMY (MISCELLANEOUS) ×3 IMPLANT
PENCIL ELECTRO HAND CTR (MISCELLANEOUS) ×3 IMPLANT
POUCH ENDO CATCH 10MM SPEC (MISCELLANEOUS) ×3 IMPLANT
RELOAD STAPLE 45 3.5 BLU ETS (ENDOMECHANICALS) IMPLANT
RELOAD STAPLE TA45 3.5 REG BLU (ENDOMECHANICALS) ×3 IMPLANT
SCISSORS METZENBAUM CVD 33 (INSTRUMENTS) ×3 IMPLANT
SLEEVE ENDOPATH XCEL 5M (ENDOMECHANICALS) ×6 IMPLANT
STRIP CLOSURE SKIN 1/2X4 (GAUZE/BANDAGES/DRESSINGS) ×1 IMPLANT
SUT MNCRL 4-0 (SUTURE) ×3
SUT MNCRL 4-0 27XMFL (SUTURE) ×1
SUTURE MNCRL 4-0 27XMF (SUTURE) ×1 IMPLANT
SWABSTK COMLB BENZOIN TINCTURE (MISCELLANEOUS) ×3 IMPLANT
TROCAR XCEL 12X100 BLDLESS (ENDOMECHANICALS) ×3 IMPLANT
TROCAR XCEL NON-BLD 5MMX100MML (ENDOMECHANICALS) ×3 IMPLANT
TUBING INSUFFLATOR HI FLOW (MISCELLANEOUS) ×3 IMPLANT

## 2015-08-11 NOTE — Progress Notes (Signed)
Pt c/o nausea after drinking gingerale. Dr Tonita CongWoodham aware and spoke with pt with plans to ADM.

## 2015-08-11 NOTE — Anesthesia Procedure Notes (Signed)
Procedure Name: Intubation Date/Time: 08/11/2015 7:39 AM Performed by: Derinda LateIACONE, Jaylyne Breese Pre-anesthesia Checklist: Patient identified, Emergency Drugs available, Suction available, Patient being monitored and Timeout performed Patient Re-evaluated:Patient Re-evaluated prior to inductionOxygen Delivery Method: Circle system utilized Preoxygenation: Pre-oxygenation with 100% oxygen Intubation Type: IV induction Ventilation: Mask ventilation without difficulty Laryngoscope Size: Miller and 2 Grade View: Grade I Tube type: Oral Tube size: 7.0 mm Number of attempts: 1 Airway Equipment and Method: Stylet Placement Confirmation: ETT inserted through vocal cords under direct vision,  positive ETCO2 and breath sounds checked- equal and bilateral Secured at: 21 cm Tube secured with: Tape Dental Injury: Teeth and Oropharynx as per pre-operative assessment

## 2015-08-11 NOTE — Anesthesia Preprocedure Evaluation (Addendum)
Anesthesia Evaluation  Patient identified by MRN, date of birth, ID band Patient awake    Reviewed: Allergy & Precautions, NPO status , Patient's Chart, lab work & pertinent test results  Airway Mallampati: II  TM Distance: >3 FB Neck ROM: Full    Dental no notable dental hx.    Pulmonary Current Smoker,    Pulmonary exam normal        Cardiovascular negative cardio ROS Normal cardiovascular exam     Neuro/Psych Seizures -, Well Controlled,  negative psych ROS   GI/Hepatic negative GI ROS, Neg liver ROS,   Endo/Other  negative endocrine ROS  Renal/GU negative Renal ROS  negative genitourinary   Musculoskeletal negative musculoskeletal ROS (+)   Abdominal Normal abdominal exam  (+)   Peds negative pediatric ROS (+)  Hematology  (+) anemia ,   Anesthesia Other Findings   Reproductive/Obstetrics negative OB ROS                            Anesthesia Physical Anesthesia Plan  ASA: II  Anesthesia Plan: General   Post-op Pain Management:    Induction: Intravenous  Airway Management Planned: Oral ETT  Additional Equipment:   Intra-op Plan:   Post-operative Plan: Extubation in OR  Informed Consent: I have reviewed the patients History and Physical, chart, labs and discussed the procedure including the risks, benefits and alternatives for the proposed anesthesia with the patient or authorized representative who has indicated his/her understanding and acceptance.   Dental advisory given  Plan Discussed with: CRNA and Surgeon  Anesthesia Plan Comments:         Anesthesia Quick Evaluation

## 2015-08-11 NOTE — Discharge Instructions (Signed)
Laparoscopic Cholecystectomy, Care After °Refer to this sheet in the next few weeks. These instructions provide you with information about caring for yourself after your procedure. Your health care provider may also give you more specific instructions. Your treatment has been planned according to current medical practices, but problems sometimes occur. Call your health care provider if you have any problems or questions after your procedure. °WHAT TO EXPECT AFTER THE PROCEDURE °After your procedure, it is common to have: °· Pain at your incision sites. You will be given pain medicines to control your pain. °· Mild nausea or vomiting. This should improve after the first 24 hours. °· Bloating and possible shoulder pain from the gas that was used during the procedure. This will improve after the first 24 hours. °HOME CARE INSTRUCTIONS °Incision Care °· Follow instructions from your health care provider about how to take care of your incisions. Make sure you: °¨ Wash your hands with soap and water before you change your bandage (dressing). If soap and water are not available, use hand sanitizer. °¨ Change your dressing as told by your health care provider. °¨ Leave stitches (sutures), skin glue, or adhesive strips in place. These skin closures may need to be in place for 2 weeks or longer. If adhesive strip edges start to loosen and curl up, you may trim the loose edges. Do not remove adhesive strips completely unless your health care provider tells you to do that. °· Do not take baths, swim, or use a hot tub until your health care provider approves. Ask your health care provider if you can take showers. You may only be allowed to take sponge baths for bathing. °General Instructions °· Take over-the-counter and prescription medicines only as told by your health care provider. °· Do not drive or operate heavy machinery while taking prescription pain medicine. °· Return to your normal diet as told by your health care  provider. °· Do not lift anything that is heavier than 10 lb (4.5 kg). °· Do not play contact sports for one week or until your health care provider approves. °SEEK MEDICAL CARE IF:  °· You have redness, swelling, or pain at the site of your incision. °· You have fluid, blood, or pus coming from your incision. °· You notice a bad smell coming from your incision area. °· Your surgical incisions break open. °· You have a fever. °SEEK IMMEDIATE MEDICAL CARE IF: °· You develop a rash. °· You have difficulty breathing. °· You have chest pain. °· You have increasing pain in your shoulders (shoulder strap areas). °· You faint or have dizzy episodes while you are standing. °· You have severe pain in your abdomen. °· You have nausea or vomiting that lasts for more than one day. °  °This information is not intended to replace advice given to you by your health care provider. Make sure you discuss any questions you have with your health care provider. °  °Document Released: 05/13/2005 Document Revised: 02/01/2015 Document Reviewed: 12/23/2012 °Elsevier Interactive Patient Education ©2016 [employer] ° °

## 2015-08-11 NOTE — Interval H&P Note (Signed)
History and Physical Interval Note:  08/11/2015 7:00 AM  Breanna Meyer  has presented today for surgery, with the diagnosis of GALLSTONES,PANCREATITIS  The various methods of treatment have been discussed with the patient and family. After consideration of risks, benefits and other options for treatment, the patient has consented to  Procedure(s): LAPAROSCOPIC CHOLECYSTECTOMY WITH INTRAOPERATIVE CHOLANGIOGRAM (N/A) as a surgical intervention .  The patient's history has been reviewed, patient examined, no change in status, stable for surgery.  I have reviewed the patient's chart and labs.  Questions were answered to the patient's satisfaction.  Patient has a continued cough today, will proceed if approved by anesthesia.   Ricarda Frameharles Eros Montour

## 2015-08-11 NOTE — Op Note (Signed)
Laparoscopic Cholecystectomy  Pre-operative Diagnosis: Gallstone pancreatitis  Post-operative Diagnosis: Same  Procedure: Laparoscopic cholecystectomy with intraoperative cholangiogram  Surgeon: Leonette Mostharles T. Tonita CongWoodham, MD FACS  Anesthesia: Gen. with endotracheal tube  Assistant: None  Procedure Details  The patient was seen again in the Holding Room. The benefits, complications, treatment options, and expected outcomes were discussed with the patient. The risks of bleeding, infection, recurrence of symptoms, failure to resolve symptoms, bile duct damage, bile duct leak, retained common bile duct stone, bowel injury, any of which could require further surgery and/or ERCP, stent, or papillotomy were reviewed with the patient. The likelihood of improving the patient's symptoms with return to their baseline status is good.  The patient and/or family concurred with the proposed plan, giving informed consent.  The patient was taken to Operating Room, identified as Breanna Meyer and the procedure verified as Laparoscopic Cholecystectomy.  A Time Out was held and the above information confirmed.  Prior to the induction of general anesthesia, antibiotic prophylaxis was administered. VTE prophylaxis was in place. General endotracheal anesthesia was then administered and tolerated well. After the induction, the abdomen was prepped with Chloraprep and draped in the sterile fashion. The patient was positioned in the supine position.  Local anesthetic  was injected into the skin near the umbilicus and an incision made. The Veress needle was placed. Pneumoperitoneum was then created with CO2 and tolerated well without any adverse changes in the patient's vital signs. A 5mm port was placed in the periumbilical position and the abdominal cavity was explored.  Two 5-mm ports were placed in the right upper quadrant and a 12 mm epigastric port was placed all under direct vision. All skin incisions  were infiltrated with  a local anesthetic agent before making the incision and placing the trocars.   The patient was positioned  in reverse Trendelenburg, tilted slightly to the patient's left.  Dense inflammatory adhesions of the omentum were noted over the liver with liver having inflammatory adhesions to the anterior abdominal wall consistent with chronic cholecystitis in the setting of some of his had gallstone pancreatitis. After meticulous dissection of these adhesions the gallbladder was identified, the fundus grasped and retracted cephalad. Adhesions were lysed bluntly and with electrocautery. The infundibulum was grasped and retracted laterally, exposing the peritoneum overlying the triangle of Calot. This was then divided and exposed in a blunt fashion. The area was noted to have dense inflammatory adhesions and the duct coming from the gallbladder was noted to be very dilated.  At this point a cholangiogram was performed. Using a Kumar catheter and clamp and fluoroscopy Conray was instilled into the gallbladder itself and visualized going through the ducts and immediately into the duodenum. Numerous attempts were made to use a cholangiogram to show the proximal common duct as well as the hepatic ducts. These were never fully visualized. The catheter was moved up on the gallbladder to ensure that the contrast being instilled into the gallbladder, which filled with contrast. Despite this a true proximal common duct was never visualized. There was no visible evidence of choledocholithiasis.  Given our cholangiogram findings and the dense inflammatory lesions at the infundibulum of the gallbladder the decision was made to do an antegrade dissection. The gallbladder was taken from the gallbladder fossa in an antegrade fashion with the electrocautery. Once the gallbladder was completely freed from the liver was able to be manipulated laterally for visualizations of the entirety of the ducts going to the gallbladder. The artery  was identified and freed  with a [place] and serially clipped with endoclips and cut with EndoShears. It appeared patient had a very short cystic duct going into a common duct that was tented towards the gallbladder. Because of this the decision was then made to use a stapler to remove the gallbladder. A 45 mm blue load GIA stapler was brought to the field and placed across the infundibulum of the gallbladder. It was fired which released the gallbladder from the tented ducts.  The gallbladder was removed and placed in an Endocatch bag. The liver bed was irrigated and inspected. Hemostasis was achieved with the electrocautery. Copious irrigation was utilized and was repeatedly aspirated until clear.  The gallbladder and Endocatch sac were then removed through the epigastric port site.   Inspection of the right upper quadrant was performed. No bleeding, bile duct injury or leak, or bowel injury was noted. Pneumoperitoneum was released. 4-0 subcuticular Monocryl was used to close the skin. Steristrips and Mastisol and sterile dressings were  applied.  The patient was then extubated and brought to the recovery room in stable condition. Sponge, lap, and needle counts were correct at closure and at the conclusion of the case.   Findings: Chronic Cholecystitis   Estimated Blood Loss: 20 mL         Drains: None         Specimens: Gallbladder           Complications: none               Breanna Leicht T. Tonita Cong, MD, FACS

## 2015-08-11 NOTE — H&P (View-Only) (Signed)
Patient ID: Breanna Meyer, female   DOB: Dec 21, 1985, 30 y.o.   MRN: [id number]  CC: ABDOMINAL PAIN  HPI Breanna Meyer is a 30 y.o. female presents to clinic for follow-up from recent ER visits for abdominal pain. Patient states that several weeks ago she had horrible abdominal pain for which she sought care in the emergency department. She had labs drawn however the weight was along that she eventually went home without being seen. She states that she had a formal abdominal pain for approximately 5 days and was unable to eat anything but was able to stay hydrated. She was called from the ER due to abnormal labs her to return to the ER for evaluation but was unable to do so for 5 days. Upon return to the emergency department her pain had completely resolved and her abnormal labs have normalized. During the pain she had nausea with some vomiting as well as some diarrhea associated with her abdominal pain chills but no fevers. Currently she denies any complaints and states she has returned to her usual state of excellent health. She does have a history of seizures but is well controlled on Keppra.  HPI  Past Medical History  Diagnosis Date  . Seizures (HCC) 1st Seizure- 2007  . Anemia     During Pregnancy    Past Surgical History  Procedure Laterality Date  . Tubal ligation  2015    Family History  Problem Relation Age of Onset  . Hypertension Mother     Social History Social History  Substance Use Topics  . Smoking status: Current Every Day Smoker -- 0.50 packs/day    Types: Cigarettes  . Smokeless tobacco: Never Used  . Alcohol Use: Yes     Comment: 10 shots/ Liquor weekly    No Known Allergies  Current Outpatient Prescriptions  Medication Sig Dispense Refill  . levETIRAcetam (KEPPRA) 500 MG tablet Take 500 mg by mouth 2 (two) times daily.  11   No current facility-administered medications for this visit.     Review of Systems A multi-point review of systems was asked and  was negative except for the findings documented in the history of present illness  Physical Exam Blood pressure 119/69, pulse 83, temperature 98.2 F (36.8 C), temperature source Oral, height 5\' 7"  (1.702 m), weight 82.555 kg (182 lb), last menstrual period 07/13/2015. CONSTITUTIONAL: No acute distress. EYES: Pupils are equal, round, and reactive to light, Sclera are non-icteric. EARS, NOSE, MOUTH AND THROAT: The oropharynx is clear. The oral mucosa is pink and moist. Hearing is intact to voice. LYMPH NODES:  Lymph nodes in the neck are normal. RESPIRATORY:  Lungs are clear. There is normal respiratory effort, with equal breath sounds bilaterally, and without pathologic use of accessory muscles. CARDIOVASCULAR: Heart is regular without murmurs, gallops, or rubs. GI: The abdomen is  soft, nontender, and nondistended. There are no palpable masses. There is no hepatosplenomegaly. There are normal bowel sounds in all quadrants. GU: Rectal deferred.   MUSCULOSKELETAL: Normal muscle strength and tone. No cyanosis or edema.   SKIN: Turgor is good and there are no pathologic skin lesions or ulcers. NEUROLOGIC: Motor and sensation is grossly normal. Cranial nerves are grossly intact. PSYCH:  Oriented to person, place and time. Affect is normal.  Data Reviewed Images and labs reviewed. Labs from February 18 are concerning for gallstone pancreatitis, labs from February 22nd are almost all completed within normal limits. Ultrasound obtained from ER does show evidence of cholelithiasis. I have personally  reviewed the patient's imaging, laboratory findings and medical records.    Assessment    History of gallstone pancreatitis    Plan    30-year-old female has had a recent bout of gallstone pancreatitis. Discussed that given her findings with continued gallstones that is possible for Recurrent pancreatitis. Discussed that every bout of pancreatitis tends to be more severe than the one before this and  that it is recommended to have her gallbladder removed. I discussed the procedure in detail.  The patient was given educational material.  We discussed the risks and benefits of a laparoscopic cholecystectomy and possible cholangiogram including, but not limited to bleeding, infection, injury to surrounding structures such as the intestine or liver, bile leak, retained gallstones, need to convert to an open procedure, prolonged diarrhea, blood clots such as  DVT, common bile duct injury, anesthesia risks, and possible need for additional procedures.  The likelihood of improvement in symptoms and return to the patient's normal status is good. We discussed the typical post-operative recovery course. Patient voiced understanding and wished to proceed. Plan for surgery on 08/11/2015     Time spent with the patient was 60 minutes, with more than 50% of the time spent in face-to-face education, counseling and care coordination.     [place] Silveria, MD FACS General Surgeon 07/26/2015, 11:06 AM     

## 2015-08-11 NOTE — Transfer of Care (Signed)
Immediate Anesthesia Transfer of Care Note  Patient: Breanna Meyer  Procedure(s) Performed: Procedure(s): LAPAROSCOPIC CHOLECYSTECTOMY WITH INTRAOPERATIVE CHOLANGIOGRAM (N/A)  Patient Location: PACU  Anesthesia Type:General  Level of Consciousness: sedated, patient cooperative and responds to stimulation  Airway & Oxygen Therapy: Patient Spontanous Breathing and Patient connected to face mask oxygen  Post-op Assessment: Report given to RN and Post -op Vital signs reviewed and stable  Post vital signs: Reviewed and stable  Last Vitals:  Filed Vitals:   08/11/15 1049 08/11/15 1050  BP: 106/79   Pulse: 100   Temp: 36.3 C 36.3 C  Resp: 18     Complications: No apparent anesthesia complications

## 2015-08-11 NOTE — Brief Op Note (Signed)
08/11/2015  11:12 AM  PATIENT:  Breanna BeachLatoya Holeman  30 y.o. female  PRE-OPERATIVE DIAGNOSIS:  GALLSTONES,PANCREATITIS  POST-OPERATIVE DIAGNOSIS:  GALLSTONES,PANCREATITIS  PROCEDURE:  Procedure(s): LAPAROSCOPIC CHOLECYSTECTOMY WITH INTRAOPERATIVE CHOLANGIOGRAM (N/A)  SURGEON:  Surgeon(s) and Role:    * Ricarda Frameharles Tawney Vanorman, MD - Primary  PHYSICIAN ASSISTANT:   ASSISTANTS: none   ANESTHESIA:   general  EBL:  Total I/O In: 1100 [I.V.:1100] Out: 100 [Blood:100]  BLOOD ADMINISTERED:none  DRAINS: none   LOCAL MEDICATIONS USED:  MARCAINE   , XYLOCAINE  and Amount: 27 ml  SPECIMEN:  Source of Specimen:  gallbladder and contents  DISPOSITION OF SPECIMEN:  PATHOLOGY  COUNTS:  YES  TOURNIQUET:  * No tourniquets in log *  DICTATION: .Dragon Dictation  PLAN OF CARE: Discharge to home after PACU but possible need for observation due to difficult surgery  PATIENT DISPOSITION:  PACU - hemodynamically stable.   Delay start of Pharmacological VTE agent (>24hrs) due to surgical blood loss or risk of bleeding: not applicable

## 2015-08-12 LAB — COMPREHENSIVE METABOLIC PANEL
ALBUMIN: 3 g/dL — AB (ref 3.5–5.0)
ALK PHOS: 54 U/L (ref 38–126)
ALT: 27 U/L (ref 14–54)
ANION GAP: 4 — AB (ref 5–15)
AST: 36 U/L (ref 15–41)
BUN: 8 mg/dL (ref 6–20)
CALCIUM: 8.3 mg/dL — AB (ref 8.9–10.3)
CHLORIDE: 110 mmol/L (ref 101–111)
CO2: 24 mmol/L (ref 22–32)
CREATININE: 0.75 mg/dL (ref 0.44–1.00)
GFR calc Af Amer: >60 mL/min (ref >60)
GFR calc non Af Amer: >60 mL/min (ref >60)
GLUCOSE: 98 mg/dL (ref 65–99)
Potassium: 4.1 mmol/L (ref 3.5–5.1)
SODIUM: 138 mmol/L (ref 135–145)
Total Bilirubin: 0.2 mg/dL — ABNORMAL LOW (ref 0.3–1.2)
Total Protein: 5.7 g/dL — ABNORMAL LOW (ref 6.5–8.1)

## 2015-08-12 LAB — APTT: aPTT: 32 seconds (ref 24–36)

## 2015-08-12 LAB — CBC
HEMATOCRIT: 28.4 % — AB (ref 35.0–47.0)
HEMOGLOBIN: 9.3 g/dL — AB (ref 12.0–16.0)
MCH: 24.3 pg — ABNORMAL LOW (ref 26.0–34.0)
MCHC: 32.9 g/dL (ref 32.0–36.0)
MCV: 74.1 fL — AB (ref 80.0–100.0)
Platelets: 235 10*3/uL (ref 150–440)
RBC: 3.83 MIL/uL (ref 3.80–5.20)
RDW: 16.5 % — AB (ref 11.5–14.5)
WBC: 8.7 10*3/uL (ref 3.6–11.0)

## 2015-08-12 LAB — PROTIME-INR
INR: 1.35
PROTHROMBIN TIME: 16.8 s — AB (ref 11.4–15.0)

## 2015-08-12 LAB — PHOSPHORUS: PHOSPHORUS: 3.2 mg/dL (ref 2.5–4.6)

## 2015-08-12 LAB — MAGNESIUM: Magnesium: 1.8 mg/dL (ref 1.7–2.4)

## 2015-08-12 MED ORDER — OXYCODONE-ACETAMINOPHEN 7.5-325 MG PO TABS: 1.0000 | ORAL_TABLET | ORAL | Status: DC | PRN

## 2015-08-12 MED ORDER — OXYCODONE-ACETAMINOPHEN 7.5-325 MG PO TABS
1.0000 | ORAL_TABLET | ORAL | Status: DC | PRN
  Administered 2015-08-12: 2 via ORAL
  Filled 2015-08-12: qty 2

## 2015-08-12 NOTE — Progress Notes (Signed)
Patient discharge via wheelchair by staff with significant other by side.denies pain at this time. Discharge instructions given as ordered and patient voiced understanding;

## 2015-08-12 NOTE — Progress Notes (Signed)
Subjective:   Patient's only like this morning is of abdominal pain near surgery sites. She denies any fevers, chills, nausea, vomiting, diarrhea, constipation. She tolerated her clear liquid diet like something more substantial.  Vital signs in last 24 hours: Temp:  [97.3 F (36.3 C)-99.2 F (37.3 C)] 98.1 F (36.7 C) (03/18 0551) Pulse Rate:  [65-100] 65 (03/18 0551) Resp:  [14-22] 16 (03/18 0551) BP: (91-125)/(52-89) 91/52 mmHg (03/18 0551) SpO2:  [98 %-100 %] 98 % (03/18 0551) Last BM Date: 08/10/15  Intake/Output from previous day: 03/17 0701 - 03/18 0700 In: 2509 [P.O.:720; I.V.:1789] Out: 1300 [Urine:800; Emesis/NG output:400; Blood:100]  Exam:  Gen.: No acute distress taxine chest: Clear to auscultation Heart: Regular rhythm Abdomen: Soft, nondistended, appropriately tender to palpation at her incision site is without evidence of erythema, drainage, infection.  Lab Results:  CBC  Recent Labs  08/12/15 0706  WBC 8.7  HGB 9.3*  HCT 28.4*  PLT 235   CMP     Component Value Date/Time   NA 138 08/12/2015 0706   K 4.1 08/12/2015 0706   CL 110 08/12/2015 0706   CO2 24 08/12/2015 0706   GLUCOSE 98 08/12/2015 0706   BUN 8 08/12/2015 0706   CREATININE 0.75 08/12/2015 0706   CALCIUM 8.3* 08/12/2015 0706   PROT 5.7* 08/12/2015 0706   ALBUMIN 3.0* 08/12/2015 0706   AST 36 08/12/2015 0706   ALT 27 08/12/2015 0706   ALKPHOS 54 08/12/2015 0706   BILITOT 0.2* 08/12/2015 0706   GFRNONAA >60 08/12/2015 0706   GFRAA >60 08/12/2015 0706   PT/INR  Recent Labs  08/12/15 0706  LABPROT 16.8*  INR 1.35    Studies/Results: Dg Cholangiogram Operative  08/11/2015  CLINICAL DATA:  30 year old female with cholelithiasis EXAM: INTRAOPERATIVE CHOLANGIOGRAM TECHNIQUE: Cholangiographic images from the C-arm fluoroscopic device were submitted for interpretation post-operatively. Please see the procedural report for the amount of contrast and the fluoroscopy time utilized.  COMPARISON:  Abdominal ultrasound 07/20/2015 FINDINGS: A single cine loop obtained at the time of intraoperative cholangiogram during laparoscopic cholecystectomy demonstrates a gallbladder filled with contrast material and opacification of the common bile duct. Additionally, there is contrast material within the duodenum. No definite stenosis, stricture, biliary dilatation or filling defect. Of note, the images are somewhat limited as the actual injection was not imaged. IMPRESSION: Limited intraoperative cholangiogram. No definite choledocholithiasis or other abnormality. Electronically Signed   By: Malachy MoanHeath  McCullough M.D.   On: 08/11/2015 09:33    Assessment/Plan: 30 year old female one day status post laparoscopic cholecystectomy for history of gallstone pancreatitis. All labs are within normal limits. Had some issues with pain control overnight. Plan to advance diet to regular, encourage ambulation, encourage incentive spirometer usage, ensure that pain is controlled on oral pain medications. Likely discharge home later today.  Ricarda Frameharles Bobbye Reinitz, MD FACS General Surgeon [facility]

## 2015-08-12 NOTE — Anesthesia Postprocedure Evaluation (Signed)
Anesthesia Post Note  Patient: Breanna Meyer  Procedure(s) Performed: Procedure(s) (LRB): LAPAROSCOPIC CHOLECYSTECTOMY WITH INTRAOPERATIVE CHOLANGIOGRAM (N/A)  Patient location during evaluation: PACU Anesthesia Type: General Level of consciousness: awake and alert, awake and oriented Pain management: pain level controlled Vital Signs Assessment: post-procedure vital signs reviewed and stable Respiratory status: spontaneous breathing Cardiovascular status: blood pressure returned to baseline Anesthetic complications: no    Last Vitals:  Filed Vitals:   08/12/15 0551 08/12/15 1313  BP: 91/52 100/60  Pulse: 65 69  Temp: 36.7 C 36.6 C  Resp: 16 16    Last Pain:  Filed Vitals:   08/12/15 1330  PainSc: 2                  Marise Knapper

## 2015-08-12 NOTE — Discharge Summary (Signed)
Patient ID: Breanna Meyer MRN: [id number] DOB/AGE: Jul 13, 1985 30 y.o.  Admit date: 08/11/2015 Discharge date: 08/12/2015  Discharge Diagnoses:  Gallstone pancreatitis  Procedures Performed: Laparoscopic cholecystectomy  Discharged Condition: good  Hospital Course: Patient admitted for observation after laparoscopic cholecystectomy was done after gallstone pancreatitis. Patient had normal labs throughout and was able to tolerate a regular diet and oral pain medications prior to discharge.  Discharge Orders:  discharge home  Disposition: 01-Home or Self Care  Discharge Medications:    Medication List    TAKE these medications        DAYQUIL PO  Take 1 tablet by mouth as needed.     levETIRAcetam 500 MG tablet  Commonly known as:  KEPPRA  Take 500 mg by mouth 2 (two) times daily.     NYQUIL PO  Take 1 tablet by mouth as needed.     oxyCODONE-acetaminophen 7.5-325 MG tablet  Commonly known as:  PERCOCET  Take 1-2 tablets by mouth every 4 (four) hours as needed for moderate pain or severe pain.         Follwup: Follow-up Information    [facility] [place]. Schedule an appointment as soon as possible for a visit in 2 weeks.   Specialty:  General Surgery   Why:  postop   Contact information:   [address], [address] [place] [id number] [phone]      Signed: Ricarda FrameCharles Dakwon Wenberg 08/12/2015, 1:01 PM    \

## 2015-08-12 NOTE — Final Progress Note (Signed)
1 Day Post-Op   Subjective:  Patient did well overnight and this morning. Desires to go home  Vital signs in last 24 hours: Temp:  [98.1 F (36.7 C)-99.2 F (37.3 C)] 98.1 F (36.7 C) (03/18 0551) Pulse Rate:  [65-83] 65 (03/18 0551) Resp:  [16-21] 16 (03/18 0551) BP: (91-125)/(52-86) 91/52 mmHg (03/18 0551) SpO2:  [98 %-100 %] 98 % (03/18 0551) Last BM Date: 08/10/15  Intake/Output from previous day: 03/17 0701 - 03/18 0700 In: 2509 [P.O.:720; I.V.:1789] Out: 1300 [Urine:800; Emesis/NG output:400; Blood:100]  GI: Abdomen is soft and nondistended. Appropriately tender to palpation incision sites. Dressings clean, dry, intact without evidence of erythema or drainage.  Lab Results:  CBC  Recent Labs  08/12/15 0706  WBC 8.7  HGB 9.3*  HCT 28.4*  PLT 235   CMP     Component Value Date/Time   NA 138 08/12/2015 0706   K 4.1 08/12/2015 0706   CL 110 08/12/2015 0706   CO2 24 08/12/2015 0706   GLUCOSE 98 08/12/2015 0706   BUN 8 08/12/2015 0706   CREATININE 0.75 08/12/2015 0706   CALCIUM 8.3* 08/12/2015 0706   PROT 5.7* 08/12/2015 0706   ALBUMIN 3.0* 08/12/2015 0706   AST 36 08/12/2015 0706   ALT 27 08/12/2015 0706   ALKPHOS 54 08/12/2015 0706   BILITOT 0.2* 08/12/2015 0706   GFRNONAA >60 08/12/2015 0706   GFRAA >60 08/12/2015 0706   PT/INR  Recent Labs  08/12/15 0706  LABPROT 16.8*  INR 1.35    Studies/Results: Dg Cholangiogram Operative  08/11/2015  CLINICAL DATA:  30 year old female with cholelithiasis EXAM: INTRAOPERATIVE CHOLANGIOGRAM TECHNIQUE: Cholangiographic images from the C-arm fluoroscopic device were submitted for interpretation post-operatively. Please see the procedural report for the amount of contrast and the fluoroscopy time utilized. COMPARISON:  Abdominal ultrasound 07/20/2015 FINDINGS: A single cine loop obtained at the time of intraoperative cholangiogram during laparoscopic cholecystectomy demonstrates a gallbladder filled with contrast  material and opacification of the common bile duct. Additionally, there is contrast material within the duodenum. No definite stenosis, stricture, biliary dilatation or filling defect. Of note, the images are somewhat limited as the actual injection was not imaged. IMPRESSION: Limited intraoperative cholangiogram. No definite choledocholithiasis or other abnormality. Electronically Signed   By: Malachy MoanHeath  McCullough M.D.   On: 08/11/2015 09:33    Assessment/Plan: 30 year old female status post laparoscopic cholecystectomy for history of gallstone pancreatitis. He recovered well without any evidence of common bile duct injury due to normal labs.   Ricarda Frameharles Shaul Trautman, MD FACS General Surgeon  08/12/2015

## 2015-08-14 LAB — SURGICAL PATHOLOGY

## 2015-08-24 ENCOUNTER — Telehealth: Payer: Self-pay

## 2015-08-24 NOTE — Telephone Encounter (Signed)
Patient's FMLA Forms were filled out and faxed.

## 2015-08-30 ENCOUNTER — Telehealth: Payer: Self-pay

## 2015-08-30 ENCOUNTER — Ambulatory Visit (INDEPENDENT_AMBULATORY_CARE_PROVIDER_SITE_OTHER): Payer: Self-pay | Admitting: Surgery

## 2015-08-30 ENCOUNTER — Encounter: Payer: Self-pay | Admitting: Surgery

## 2015-08-30 VITALS — BP 111/77 | HR 80 | Temp 98.3°F | Ht 67.0 in | Wt 181.0 lb

## 2015-08-30 DIAGNOSIS — K851 Biliary acute pancreatitis without necrosis or infection: Secondary | ICD-10-CM

## 2015-08-30 NOTE — Patient Instructions (Addendum)
Please call our office with any questions or concerns.  You are able to submerge in a tub, hot tub, or pool since you are completely healed.  Use sun block to incision area over the next year if this area will be exposed to sun. This helps decrease scarring.  You may now resume your normal activities. Listen to your body when lifting, if you have pain when lifting, stop and then try again in a few days.  If you develop redness, drainage, or pain at incision sites- call our office immediately and speak with a nurse.

## 2015-08-30 NOTE — Progress Notes (Signed)
30 year old status post gallstone pancreatitis and laparoscopic cholecystectomy. Patient doing much better patient states that she is not having any pain. Patient states that her appetite has improved. Patient states that she's having some diarrhea occasionally and some weeks where she will go to the bathroom at all. Overall patient states that she feels much better and is ready to go back to work.  Filed Vitals:   08/30/15 1121  BP: 111/77  Pulse: 80  Temp: 98.3 F (36.8 C)   PE:  GeN: NAD Abd: soft, nontender, incision sites c/d/i and healing well  A/P: Healing well after laparoscopic cholecystectomy. Patient doing wonderful. Patient in go back to work with light duty of no lifting over 15-20 pounds for 4 more weeks. Work excuse given patient to call with any questions or concerns.

## 2015-08-30 NOTE — Telephone Encounter (Signed)
Disability Form ([employer]) was filled out and faxed.

## 2015-09-06 ENCOUNTER — Telehealth: Payer: Self-pay | Admitting: General Surgery

## 2015-09-06 NOTE — Telephone Encounter (Signed)
Please call Skeet SimmerHilary, with [employer]  [phone] ext. [phone] She needs to speak with you to clarify restrictions and leave of absence from employer, on patients disability paperwork.

## 2015-09-07 NOTE — Telephone Encounter (Signed)
[employer] rep has called back and I have advised her that patient is on light restrictions for 4 weeks from 08/30/15 and there is no follow up visits scheduled at this time.

## 2015-11-14 ENCOUNTER — Emergency Department
Admission: EM | Admit: 2015-11-14 | Discharge: 2015-11-14 | Disposition: A | Discharge status: Home / Self Care | Payer: No Typology Code available for payment source | Attending: Emergency Medicine | Admitting: Emergency Medicine

## 2015-11-14 DIAGNOSIS — Y9389 Activity, other specified: Secondary | ICD-10-CM | POA: Diagnosis not present

## 2015-11-14 DIAGNOSIS — F17210 Nicotine dependence, cigarettes, uncomplicated: Secondary | ICD-10-CM | POA: Diagnosis not present

## 2015-11-14 DIAGNOSIS — S0093XA Contusion of unspecified part of head, initial encounter: Secondary | ICD-10-CM | POA: Insufficient documentation

## 2015-11-14 DIAGNOSIS — Z8669 Personal history of other diseases of the nervous system and sense organs: Secondary | ICD-10-CM | POA: Diagnosis not present

## 2015-11-14 DIAGNOSIS — S40812A Abrasion of left upper arm, initial encounter: Secondary | ICD-10-CM

## 2015-11-14 DIAGNOSIS — Z79899 Other long term (current) drug therapy: Secondary | ICD-10-CM | POA: Insufficient documentation

## 2015-11-14 DIAGNOSIS — S7001XA Contusion of right hip, initial encounter: Secondary | ICD-10-CM

## 2015-11-14 DIAGNOSIS — Y92410 Unspecified street and highway as the place of occurrence of the external cause: Secondary | ICD-10-CM | POA: Diagnosis not present

## 2015-11-14 DIAGNOSIS — Y999 Unspecified external cause status: Secondary | ICD-10-CM | POA: Diagnosis not present

## 2015-11-14 DIAGNOSIS — S40022A Contusion of left upper arm, initial encounter: Secondary | ICD-10-CM | POA: Diagnosis not present

## 2015-11-14 MED ORDER — CYCLOBENZAPRINE HCL 5 MG PO TABS: 5.0000 mg | ORAL_TABLET | Freq: Three times a day (TID) | ORAL | Status: DC | PRN

## 2015-11-14 MED ORDER — NAPROXEN 500 MG PO TABS: 500.0000 mg | ORAL_TABLET | Freq: Two times a day (BID) | ORAL | Status: DC
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
## 2017-06-26 IMAGING — US US ABDOMEN LIMITED
1 series · 14 of 25 positions shown · non-contrast
Comparison: CT of the abdomen and pelvis from 04/11/2007

CLINICAL DATA: Acute onset of elevated LFTs and elevated lipase.
Nausea. Initial encounter.

EXAM:
US ABDOMEN LIMITED - RIGHT UPPER QUADRANT

[Series 1: us abdomen limited · 0.19mm/px · 14 of 41 slices shown]
[im 1/41]
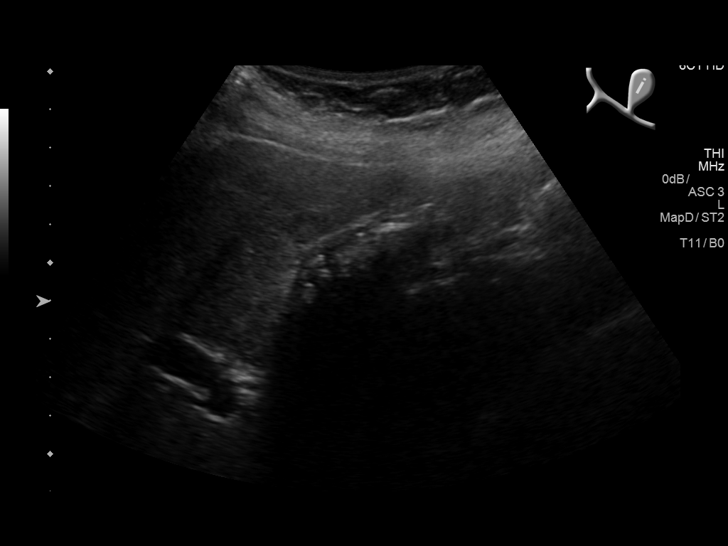
[im 4/41]
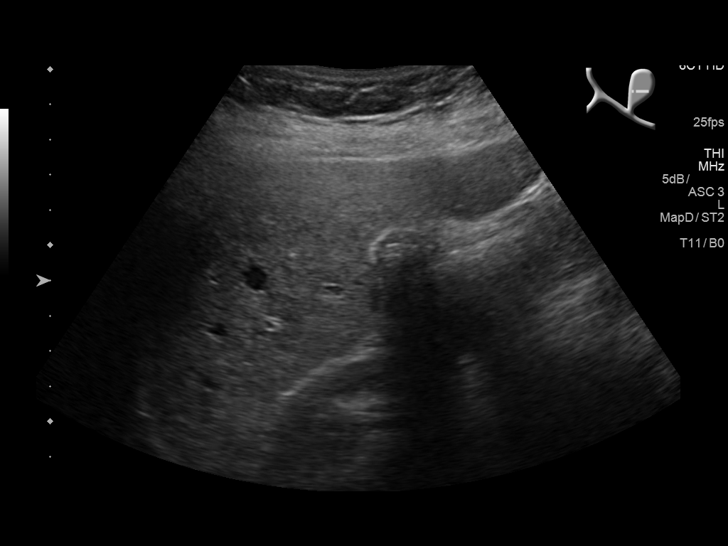
[im 7/41]
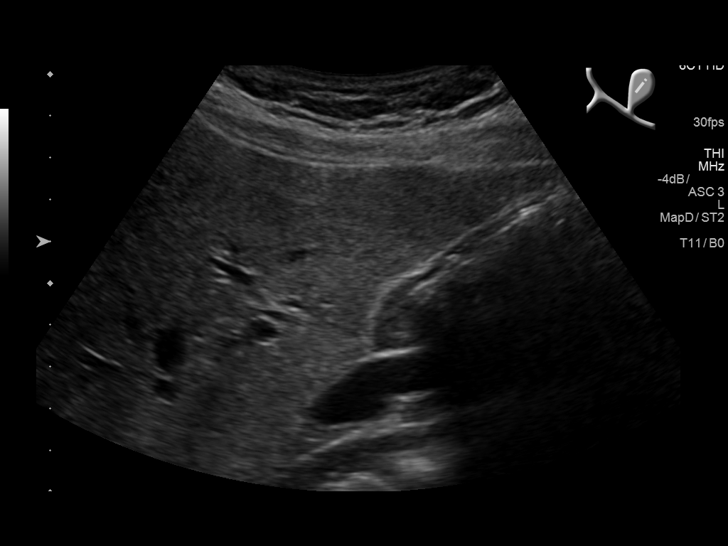
[im 11/41]
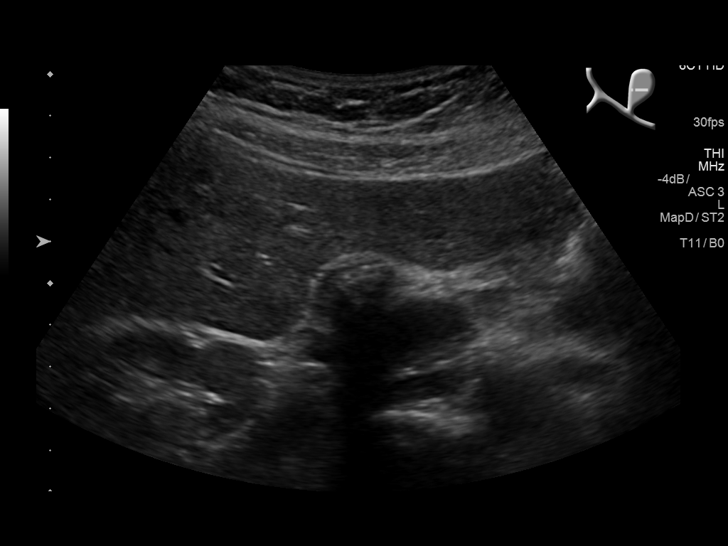
[im 14/41]
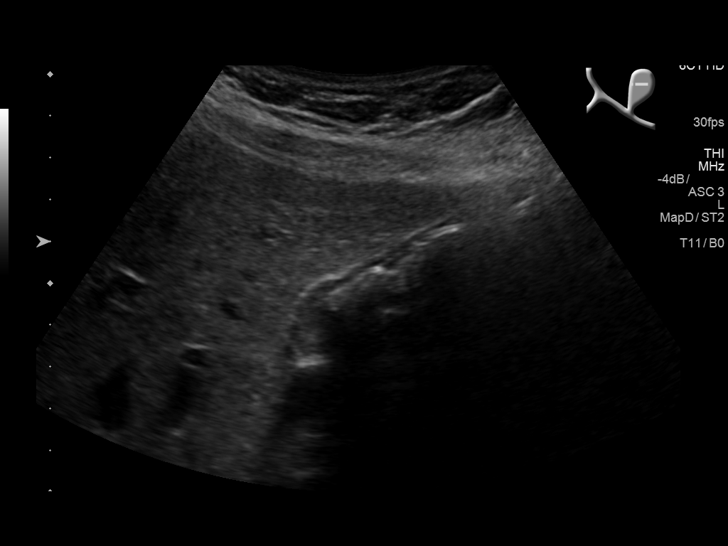
[im 16/41]
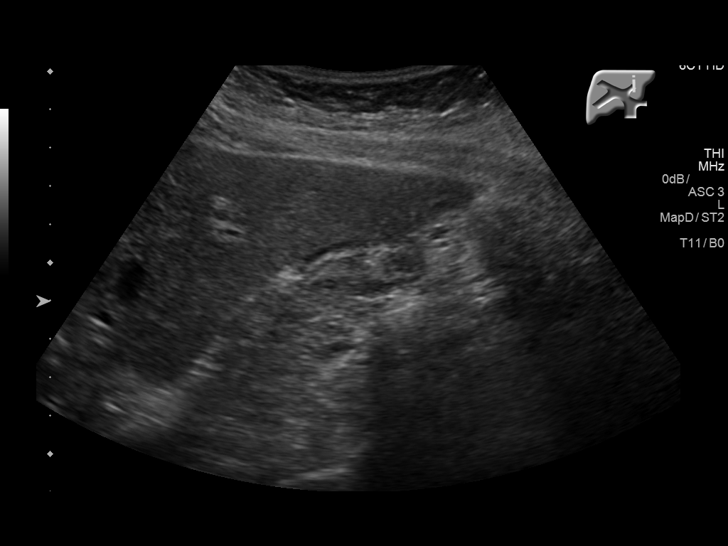
[im 19/41]
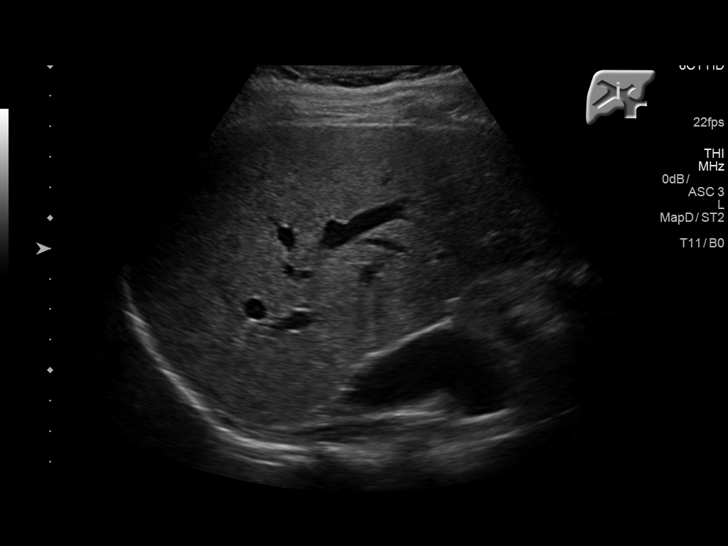
[im 22/41]
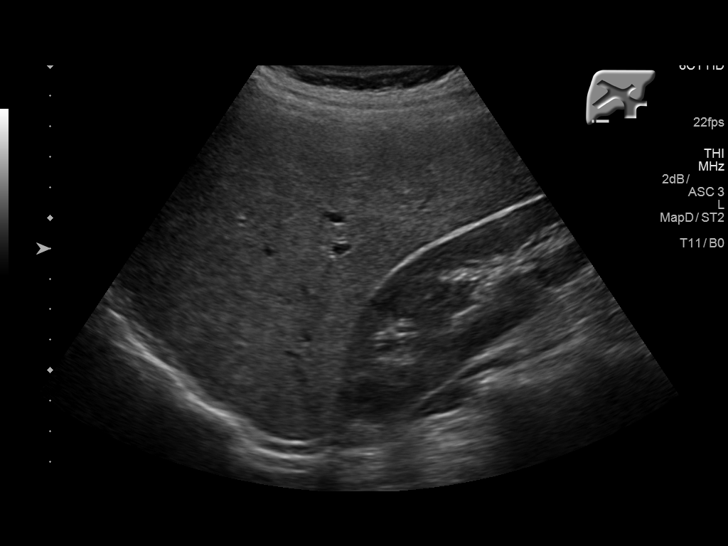
[im 26/41]
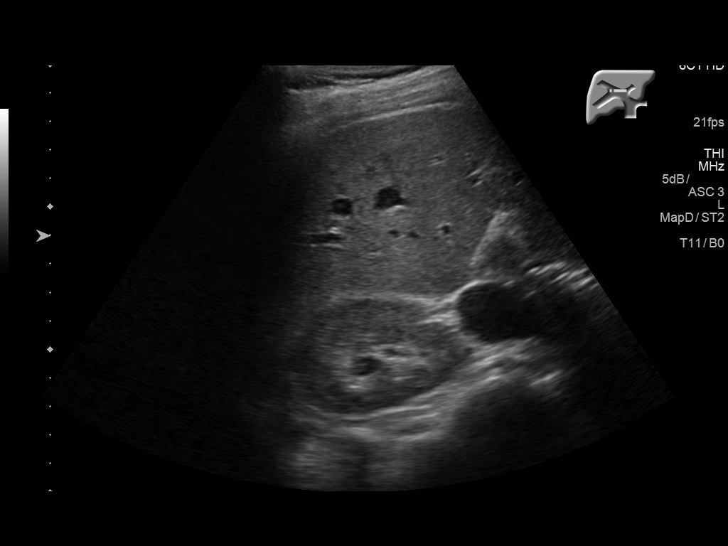
[im 27/41]
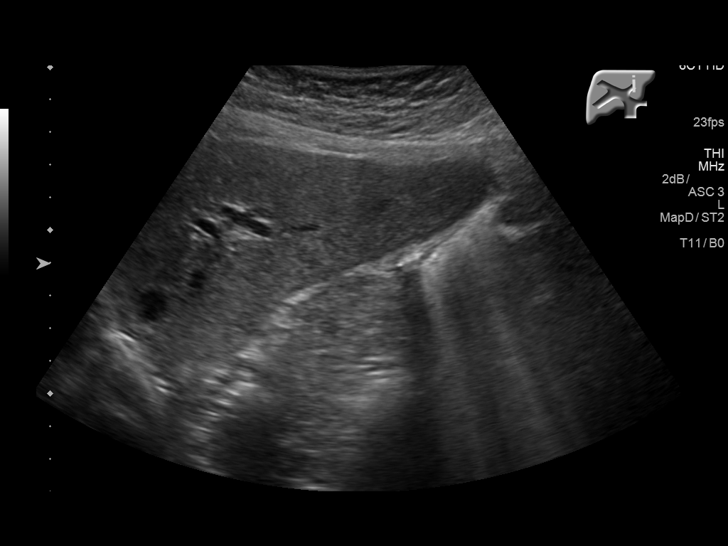
[im 31/41]
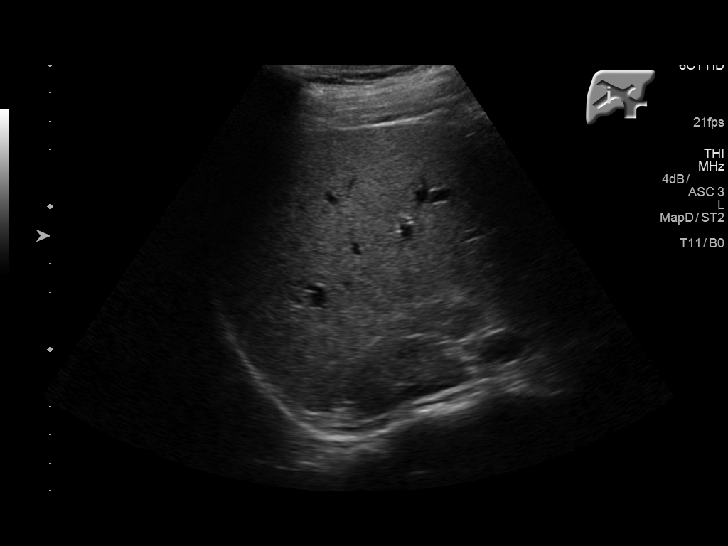
[im 34/41]
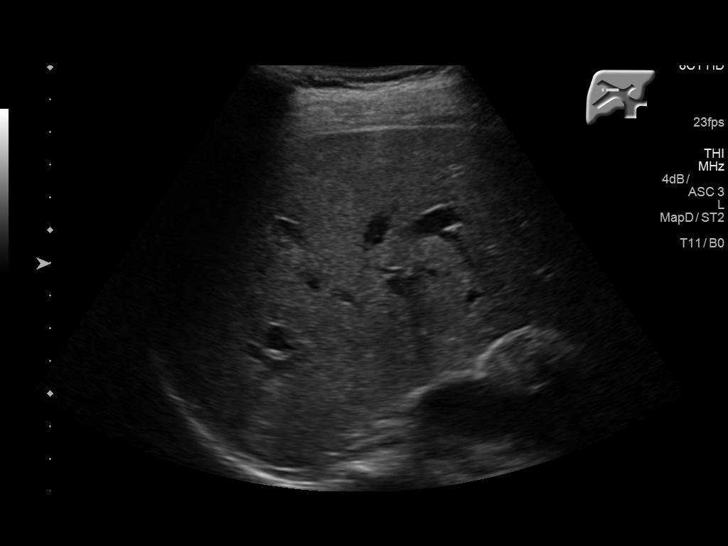
[im 37/41]
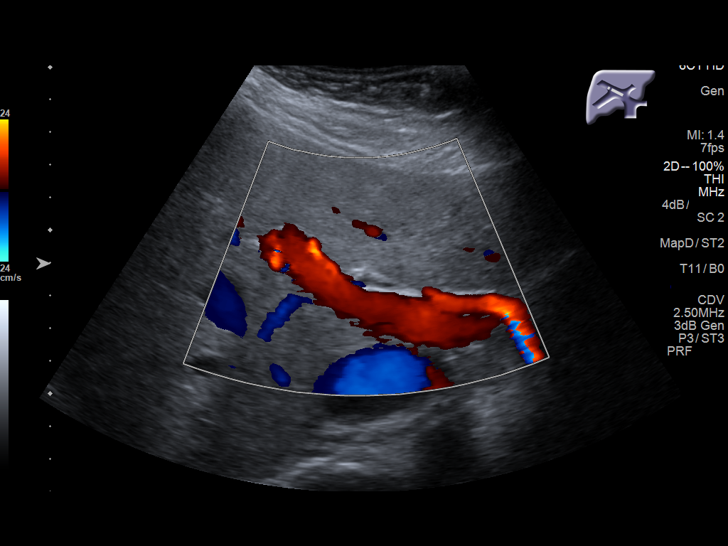
[im 41/41]
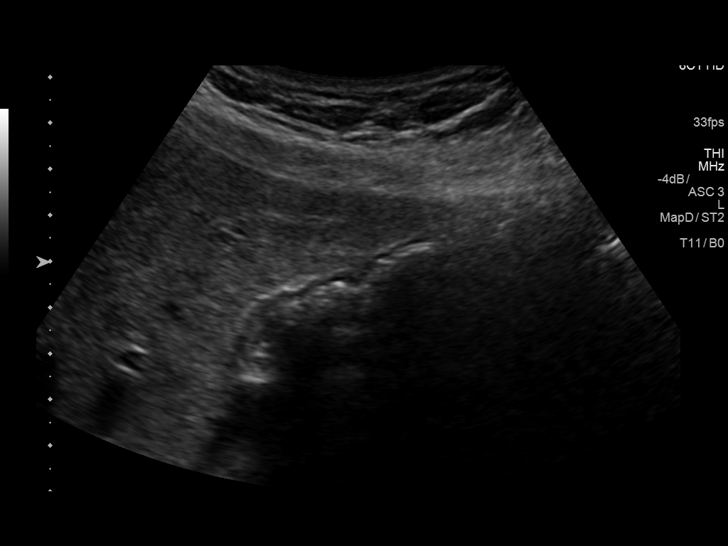

[14 of 25 positions shown; findings below may reference images not displayed]

FINDINGS: Gallbladder:

Numerous stones are seen filling the gallbladder, with a wall echo
shadow sign. No gallbladder wall thickening or pericholecystic fluid
is seen. No ultrasonographic Murphy's sign is elicited.

Common bile duct:

Diameter: 0.5 cm, within normal limits in caliber.

Liver:

No focal lesion identified. Within normal limits in parenchymal
echogenicity.
IMPRESSION: Numerous stones filling the gallbladder, with a wall echo shadow
sign. No evidence for obstruction or cholecystitis.

## 2017-10-03 ENCOUNTER — Ambulatory Visit (INDEPENDENT_AMBULATORY_CARE_PROVIDER_SITE_OTHER): Payer: Self-pay | Admitting: Nurse Practitioner

## 2017-10-03 ENCOUNTER — Encounter: Payer: Self-pay | Admitting: Nurse Practitioner

## 2017-10-03 ENCOUNTER — Other Ambulatory Visit: Payer: Self-pay

## 2017-10-03 ENCOUNTER — Ambulatory Visit: Payer: Self-pay | Admitting: Emergency Medicine

## 2017-10-03 VITALS — BP 107/55 | HR 58 | Temp 98.3°F | Ht 67.0 in | Wt 176.4 lb

## 2017-10-03 DIAGNOSIS — Z0289 Encounter for other administrative examinations: Secondary | ICD-10-CM
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
## 2019-12-21 ENCOUNTER — Other Ambulatory Visit: Payer: Self-pay | Admitting: Neurology

## 2019-12-21 DIAGNOSIS — G40209 Localization-related (focal) (partial) symptomatic epilepsy and epileptic syndromes with complex partial seizures, not intractable, without status epilepticus: Secondary | ICD-10-CM

## 2019-12-26 ENCOUNTER — Ambulatory Visit
Admission: RE | Admit: 2019-12-26 | Discharge: 2019-12-26 | Disposition: A | Discharge status: Home / Self Care | Payer: No Typology Code available for payment source | Source: Ambulatory Visit | Attending: Neurology | Admitting: Neurology

## 2019-12-26 DIAGNOSIS — G40209 Localization-related (focal) (partial) symptomatic epilepsy and epileptic syndromes with complex partial seizures, not intractable, without status epilepticus: Secondary | ICD-10-CM

## 2019-12-26 MED ORDER — GADOBENATE DIMEGLUMINE 529 MG/ML IV SOLN
16.0000 mL | Freq: Once | INTRAVENOUS | Status: AC | PRN
  Administered 2019-12-26: 16 mL via INTRAVENOUS

## 2020-01-17 ENCOUNTER — Other Ambulatory Visit: Payer: Self-pay

## 2024-04-28 ENCOUNTER — Encounter: Payer: Self-pay | Admitting: Family Medicine
